# Patient Record
Sex: Male | Born: 1975 | Hispanic: Refuse to answer
Health system: Southern US, Community
[De-identification: ages and names within clinical notes are randomized; demographics above are authoritative.]

---

## 2010-08-01 ENCOUNTER — Emergency Department (HOSPITAL_COMMUNITY): Admission: EM | Admit: 2010-08-01 | Discharge: 2010-08-01 | Payer: Self-pay | Admitting: Emergency Medicine

## 2011-03-17 ENCOUNTER — Emergency Department (HOSPITAL_COMMUNITY)
Admission: EM | Admit: 2011-03-17 | Discharge: 2011-03-17 | Disposition: A | Discharge status: Home / Self Care | Payer: BLUE CROSS/BLUE SHIELD | Attending: Emergency Medicine | Admitting: Emergency Medicine

## 2011-03-17 ENCOUNTER — Emergency Department (HOSPITAL_COMMUNITY): Payer: BLUE CROSS/BLUE SHIELD

## 2011-03-17 DIAGNOSIS — R0602 Shortness of breath: Secondary | ICD-10-CM | POA: Insufficient documentation

## 2011-03-17 DIAGNOSIS — T148XXA Other injury of unspecified body region, initial encounter: Secondary | ICD-10-CM | POA: Insufficient documentation

## 2011-03-17 DIAGNOSIS — R079 Chest pain, unspecified: Secondary | ICD-10-CM | POA: Insufficient documentation

## 2011-03-17 DIAGNOSIS — M25519 Pain in unspecified shoulder: Secondary | ICD-10-CM | POA: Insufficient documentation

## 2011-03-17 DIAGNOSIS — M7989 Other specified soft tissue disorders: Secondary | ICD-10-CM | POA: Insufficient documentation

## 2011-03-17 DIAGNOSIS — Y929 Unspecified place or not applicable: Secondary | ICD-10-CM | POA: Insufficient documentation

## 2011-03-17 DIAGNOSIS — Z794 Long term (current) use of insulin: Secondary | ICD-10-CM | POA: Insufficient documentation

## 2011-03-17 DIAGNOSIS — IMO0002 Reserved for concepts with insufficient information to code with codable children: Secondary | ICD-10-CM | POA: Insufficient documentation

## 2011-03-17 DIAGNOSIS — E119 Type 2 diabetes mellitus without complications: Secondary | ICD-10-CM | POA: Insufficient documentation

## 2011-03-17 DIAGNOSIS — S62233A Other displaced fracture of base of first metacarpal bone, unspecified hand, initial encounter for closed fracture: Secondary | ICD-10-CM | POA: Insufficient documentation

## 2011-03-17 DIAGNOSIS — M2560 Stiffness of unspecified joint, not elsewhere classified: Secondary | ICD-10-CM | POA: Insufficient documentation
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
## 2023-05-20 ENCOUNTER — Ambulatory Visit
Admission: EM | Admit: 2023-05-20 | Discharge: 2023-05-20 | Disposition: A | Discharge status: Home / Self Care | Payer: Worker's Compensation | Attending: Family Medicine | Admitting: Family Medicine

## 2023-05-20 ENCOUNTER — Other Ambulatory Visit: Payer: Self-pay

## 2023-05-20 DIAGNOSIS — M25531 Pain in right wrist: Secondary | ICD-10-CM | POA: Diagnosis not present

## 2023-05-20 DIAGNOSIS — M79642 Pain in left hand: Secondary | ICD-10-CM

## 2023-05-20 DIAGNOSIS — M25532 Pain in left wrist: Secondary | ICD-10-CM
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
## 2023-05-20 NOTE — Discharge Instructions (Signed)
Ice and elevate your wrist tonight.  Get your x-ray done as soon as possible.  Will notify if there is any problem on it.  Also contact your Workmen's Comp. clinic your employer is asking to go to
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
## 2023-05-21 ENCOUNTER — Other Ambulatory Visit: Payer: Self-pay | Admitting: Physician Assistant

## 2023-05-21 ENCOUNTER — Ambulatory Visit (HOSPITAL_COMMUNITY): Payer: Self-pay

## 2023-05-21 ENCOUNTER — Ambulatory Visit: Payer: Self-pay

## 2023-05-21 DIAGNOSIS — M25532 Pain in left wrist: Secondary | ICD-10-CM

## 2023-05-25 ENCOUNTER — Telehealth: Payer: Self-pay | Admitting: Physician Assistant

## 2023-05-25 ENCOUNTER — Other Ambulatory Visit: Payer: Self-pay | Admitting: Physician Assistant

## 2023-05-25 ENCOUNTER — Other Ambulatory Visit: Payer: Self-pay | Admitting: Family Medicine

## 2023-05-25 ENCOUNTER — Ambulatory Visit: Payer: Self-pay

## 2023-05-25 DIAGNOSIS — M25532 Pain in left wrist: Secondary | ICD-10-CM

## 2023-05-25 NOTE — Telephone Encounter (Signed)
Reviewed negative x-ray report with patient. He verbalized understanding.

## 2023-07-01 ENCOUNTER — Other Ambulatory Visit: Payer: Self-pay | Admitting: Orthopedic Surgery

## 2023-07-01 DIAGNOSIS — M25532 Pain in left wrist: Secondary | ICD-10-CM

## 2023-07-02 ENCOUNTER — Inpatient Hospital Stay: Admission: RE | Admit: 2023-07-02 | Payer: Self-pay | Source: Ambulatory Visit

## 2023-07-12 ENCOUNTER — Ambulatory Visit
Admission: RE | Admit: 2023-07-12 | Discharge: 2023-07-12 | Disposition: A | Discharge status: Home / Self Care | Payer: Worker's Compensation | Source: Ambulatory Visit | Attending: Orthopedic Surgery | Admitting: Orthopedic Surgery

## 2023-07-12 DIAGNOSIS — M25532 Pain in left wrist: Secondary | ICD-10-CM

## 2023-07-20 ENCOUNTER — Other Ambulatory Visit: Payer: Self-pay

## 2023-07-28 ENCOUNTER — Other Ambulatory Visit: Payer: Self-pay | Admitting: Orthopedic Surgery

## 2023-07-28 DIAGNOSIS — S62142A Displaced fracture of body of hamate [unciform] bone, left wrist, initial encounter for closed fracture: Secondary | ICD-10-CM

## 2023-07-30 ENCOUNTER — Other Ambulatory Visit: Payer: Self-pay

## 2023-08-03 ENCOUNTER — Other Ambulatory Visit: Payer: Self-pay | Admitting: Orthopedic Surgery

## 2023-08-03 ENCOUNTER — Ambulatory Visit
Admission: RE | Admit: 2023-08-03 | Discharge: 2023-08-03 | Disposition: A | Discharge status: Home / Self Care | Payer: Self-pay | Source: Ambulatory Visit | Attending: Orthopedic Surgery | Admitting: Orthopedic Surgery

## 2023-08-03 DIAGNOSIS — S62142A Displaced fracture of body of hamate [unciform] bone, left wrist, initial encounter for closed fracture: Secondary | ICD-10-CM
